# Patient Record
Sex: Female | Born: 2000
Health system: Southern US, Community
[De-identification: ages and names within clinical notes are randomized; demographics above are authoritative.]

## PROBLEM LIST (undated history)

## (undated) DIAGNOSIS — N946 Dysmenorrhea, unspecified: Secondary | ICD-10-CM

## (undated) HISTORY — PX: ANKLE SURGERY: SHX546

## (undated) HISTORY — DX: Dysmenorrhea, unspecified: N94.6

## (undated) HISTORY — PX: OTHER SURGICAL HISTORY: SHX169

## (undated) HISTORY — PX: NOSE SURGERY: SHX723

## (undated) HISTORY — PX: SHOULDER SURGERY: SHX246

---
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
## 2020-01-03 ENCOUNTER — Other Ambulatory Visit: Payer: Self-pay | Admitting: Obstetrics and Gynecology

## 2020-01-03 DIAGNOSIS — N946 Dysmenorrhea, unspecified: Secondary | ICD-10-CM

## 2020-01-03 DIAGNOSIS — Z30011 Encounter for initial prescription of contraceptive pills: Secondary | ICD-10-CM

## 2020-02-08 ENCOUNTER — Other Ambulatory Visit: Payer: Self-pay | Admitting: Obstetrics and Gynecology

## 2020-02-08 DIAGNOSIS — Z30011 Encounter for initial prescription of contraceptive pills: Secondary | ICD-10-CM

## 2020-02-08 DIAGNOSIS — N946 Dysmenorrhea, unspecified: Secondary | ICD-10-CM

## 2020-02-16 ENCOUNTER — Other Ambulatory Visit: Payer: Self-pay | Admitting: Obstetrics and Gynecology

## 2020-02-16 DIAGNOSIS — N946 Dysmenorrhea, unspecified: Secondary | ICD-10-CM

## 2020-02-16 DIAGNOSIS — Z30011 Encounter for initial prescription of contraceptive pills: Secondary | ICD-10-CM

## 2020-02-29 NOTE — Progress Notes (Signed)
PCP:  Earleen Newport, NP   Chief Complaint  Patient presents with  . Annual Exam     HPI:      Tara Perez is a 19 y.o. G0P0000 whose LMP was Patient's last menstrual period was 02/09/2020., presents today for her [facility] f/u.  Her menses are regular every 28-30 days, lasting 3 days.  Dysmenorrhea mild, much improved with OCPs. She does not have intermenstrual bleeding.  Sex activity: never.  Last Pap: N/A due to age Hx of STDs: none  There is no FH of breast cancer. There is no FH of ovarian cancer. The patient does do self-breast exams.  Tobacco use: The patient denies current or previous tobacco use. Alcohol use: none No drug use.  Exercise: moderately active  She does get adequate calcium but not Vitamin D in her diet.    Upstream - 03/01/20 0806      Pregnancy Intention Screening   Does the patient want to become pregnant in the next year? No    Does the patient's partner want to become pregnant in the next year? No    Would the patient like to discuss contraceptive options today? No      Contraception Wrap Up   Current Method Oral Contraceptive    Contraception Counseling Provided Yes          The pregnancy intention screening data noted above was reviewed. Potential methods of contraception were discussed. The patient elected to proceed with Oral Contraceptive.    Past Medical History:  Diagnosis Date  . Dysmenorrhea in adolescent     Past Surgical History:  Procedure Laterality Date  . ANKLE SURGERY    . NOSE SURGERY    . OTHER SURGICAL HISTORY     tubes in both ears, about 1 or 19 yrs old  . OTHER SURGICAL HISTORY  2014   miniscus   . SHOULDER SURGERY      No family history on file.  Social History   Socioeconomic History  . Marital status: Single    Spouse name: Not on file  . Number of children: Not on file  . Years of education: Not on file  . Highest education level: Not on file  Occupational History  . Not on file  Tobacco  Use  . Smoking status: Never Smoker  . Smokeless tobacco: Never Used  Vaping Use  . Vaping Use: Never used  Substance and Sexual Activity  . Alcohol use: Never  . Drug use: Never  . Sexual activity: Never    Birth control/protection: None  Other Topics Concern  . Not on file  Social History Narrative  . Not on file   Social Determinants of Health   Financial Resource Strain:   . Difficulty of Paying Living Expenses:   Food Insecurity:   . Worried About Programme researcher, broadcasting/film/video in the Last Year:   . Barista in the Last Year:   Transportation Needs:   . Freight forwarder (Medical):   Marland Kitchen Lack of Transportation (Non-Medical):   Physical Activity:   . Days of Exercise per Week:   . Minutes of Exercise per Session:   Stress:   . Feeling of Stress :   Social Connections:   . Frequency of Communication with Friends and Family:   . Frequency of Social Gatherings with Friends and Family:   . Attends Religious Services:   . Active Member of Clubs or Organizations:   . Attends Club  or Organization Meetings:   Marland Kitchen Marital Status:   Intimate Partner Violence:   . Fear of Current or Ex-Partner:   . Emotionally Abused:   Marland Kitchen Physically Abused:   . Sexually Abused:      Current Outpatient Medications:  .  Norethindrone Acetate-Ethinyl Estrad-FE (MICROGESTIN 24 FE) 1-20 MG-MCG(24) tablet, Take 1 tablet by mouth daily., Disp: 84 tablet, Rfl: 3   ROS:  Review of Systems  Constitutional: Negative for fatigue, fever and unexpected weight change.  Respiratory: Negative for cough, shortness of breath and wheezing.   Cardiovascular: Negative for chest pain, palpitations and leg swelling.  Gastrointestinal: Negative for blood in stool, constipation, diarrhea, nausea and vomiting.  Endocrine: Negative for cold intolerance, heat intolerance and polyuria.  Genitourinary: Negative for dyspareunia, dysuria, flank pain, frequency, genital sores, hematuria, menstrual problem, pelvic pain,  urgency, vaginal bleeding, vaginal discharge and vaginal pain.  Musculoskeletal: Negative for back pain, joint swelling and myalgias.  Skin: Negative for rash.  Neurological: Negative for dizziness, syncope, light-headedness, numbness and headaches.  Hematological: Negative for adenopathy.  Psychiatric/Behavioral: Negative for agitation, confusion, sleep disturbance and suicidal ideas. The patient is not nervous/anxious.     Objective: BP 122/74   Ht 5\' 7"  (1.702 m)   Wt 170 lb (77.1 kg)   LMP 02/09/2020   BMI 26.63 kg/m    Physical Exam Constitutional:      Appearance: She is well-developed.  Pulmonary:     Effort: Pulmonary effort is normal.  Musculoskeletal:        General: Normal range of motion.     Cervical back: Normal range of motion.  Neurological:     Mental Status: She is alert and oriented to person, place, and time.  Skin:    General: Skin is warm.  Psychiatric:        Behavior: Behavior normal.        Thought Content: Thought content normal.     Assessment/Plan: Encounter for surveillance of contraceptive pills - Plan: Norethindrone Acetate-Ethinyl Estrad-FE (MICROGESTIN 24 FE) 1-20 MG-MCG(24) tablet; OCP RF. Doing well.   Dysmenorrhea in adolescent - Plan: Norethindrone Acetate-Ethinyl Estrad-FE (MICROGESTIN 24 FE) 1-20 MG-MCG(24) tablet; Improved with OCPs  Meds ordered this encounter  Medications  . Norethindrone Acetate-Ethinyl Estrad-FE (MICROGESTIN 24 FE) 1-20 MG-MCG(24) tablet    Sig: Take 1 tablet by mouth daily.    Dispense:  84 tablet    Refill:  3    Order Specific Question:   Supervising Provider    Answer:   02/11/2020 Nadara Mustard             GYN counsel adequate intake of calcium and vitamin D, diet and exercise, Gardasil discussed.      F/U  Return in about 1 year (around 03/01/2021).  Madix Blowe B. Hans Rusher, PA-C 03/01/2020 10:38 AM

## 2020-03-01 ENCOUNTER — Encounter: Payer: Self-pay | Admitting: Obstetrics and Gynecology

## 2020-03-01 ENCOUNTER — Ambulatory Visit (INDEPENDENT_AMBULATORY_CARE_PROVIDER_SITE_OTHER): Payer: 59 | Admitting: Obstetrics and Gynecology

## 2020-03-01 ENCOUNTER — Other Ambulatory Visit: Payer: Self-pay

## 2020-03-01 VITALS — BP 122/74 | Ht 67.0 in | Wt 170.0 lb

## 2020-03-01 DIAGNOSIS — N946 Dysmenorrhea, unspecified: Secondary | ICD-10-CM

## 2020-03-01 DIAGNOSIS — Z3041 Encounter for surveillance of contraceptive pills: Secondary | ICD-10-CM

## 2020-03-01 MED ORDER — MICROGESTIN 24 FE 1-20 MG-MCG PO TABS: 1.0000 | ORAL_TABLET | Freq: Every day | ORAL | 3 refills | Status: DC

## 2020-03-01 NOTE — Patient Instructions (Signed)
I value your feedback and entrusting us with your care. If you get a [facility] patient survey, I would appreciate you taking the time to let us know about your experience today. Thank you!  As of July 03, 2019, your lab results will be released to your MyChart immediately, before I even have a chance to see them. Please give me time to review them and contact you if there are any abnormalities. Thank you for your patience.  

## 2020-05-12 IMAGING — CR DG LUMBAR SPINE COMPLETE 4+V
5 series · 5 of 5 positions shown · non-contrast
Comparison: None.

CLINICAL DATA: Bilateral lower leg cramping.

EXAM:
LUMBAR SPINE - COMPLETE 4+ VIEW

[t l-spine a.p.]
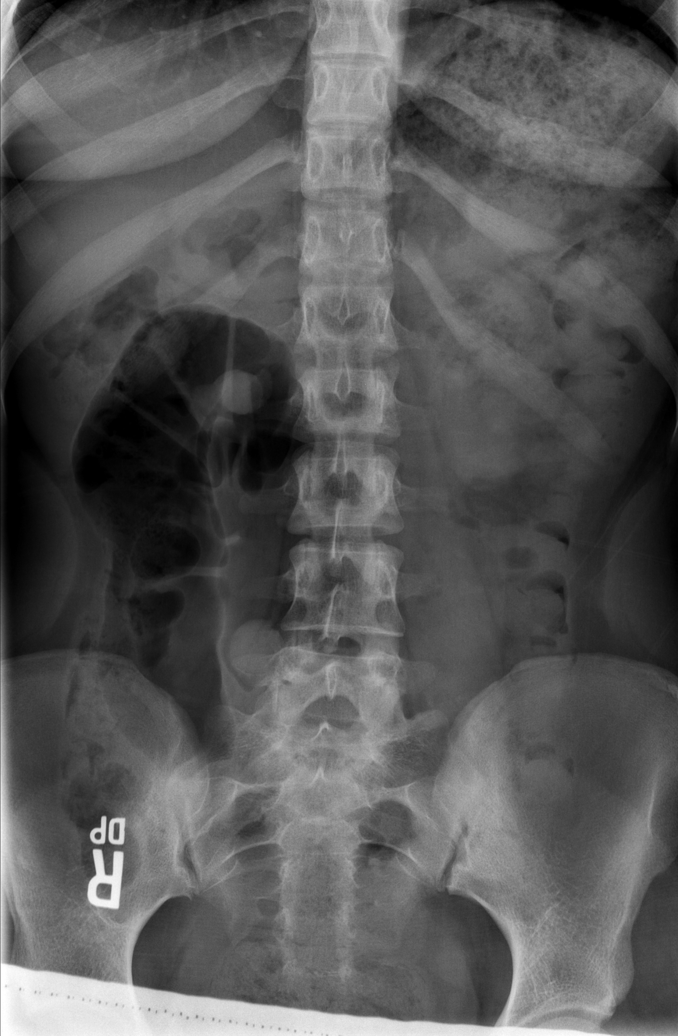

[t l-spine oblique exposure (1 of 2)]
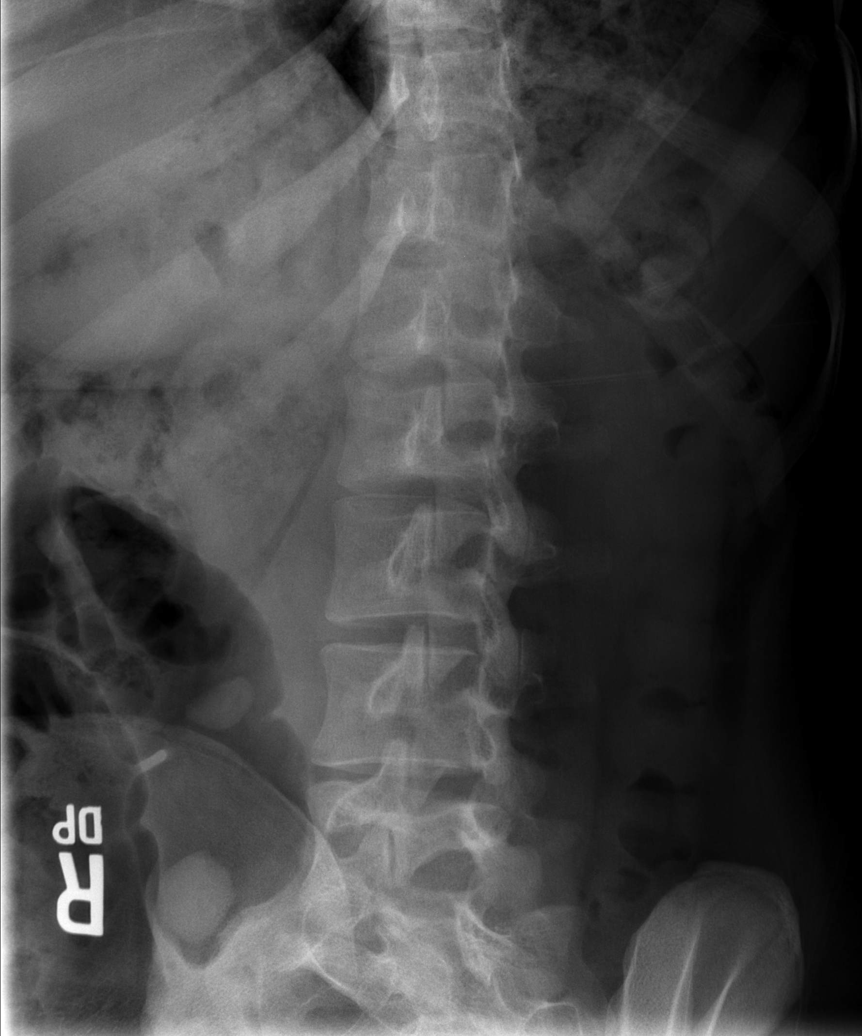

[t l-spine oblique exposure (2 of 2)]
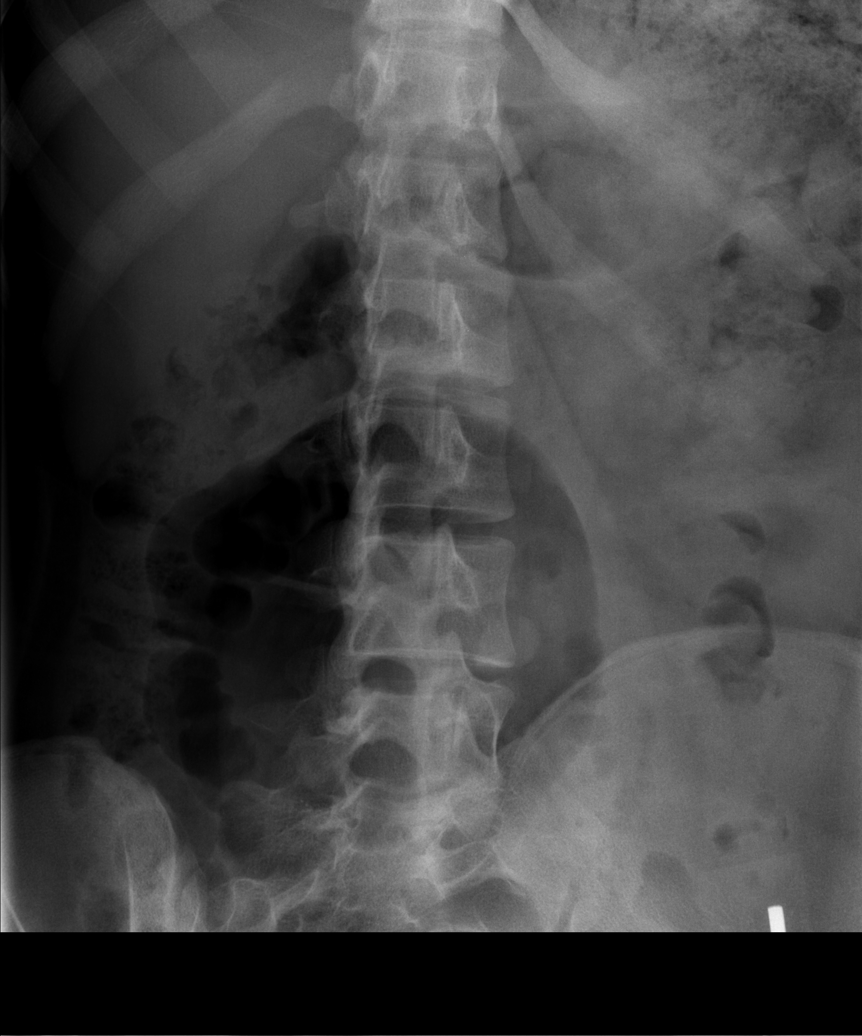

[t l-spine lat]
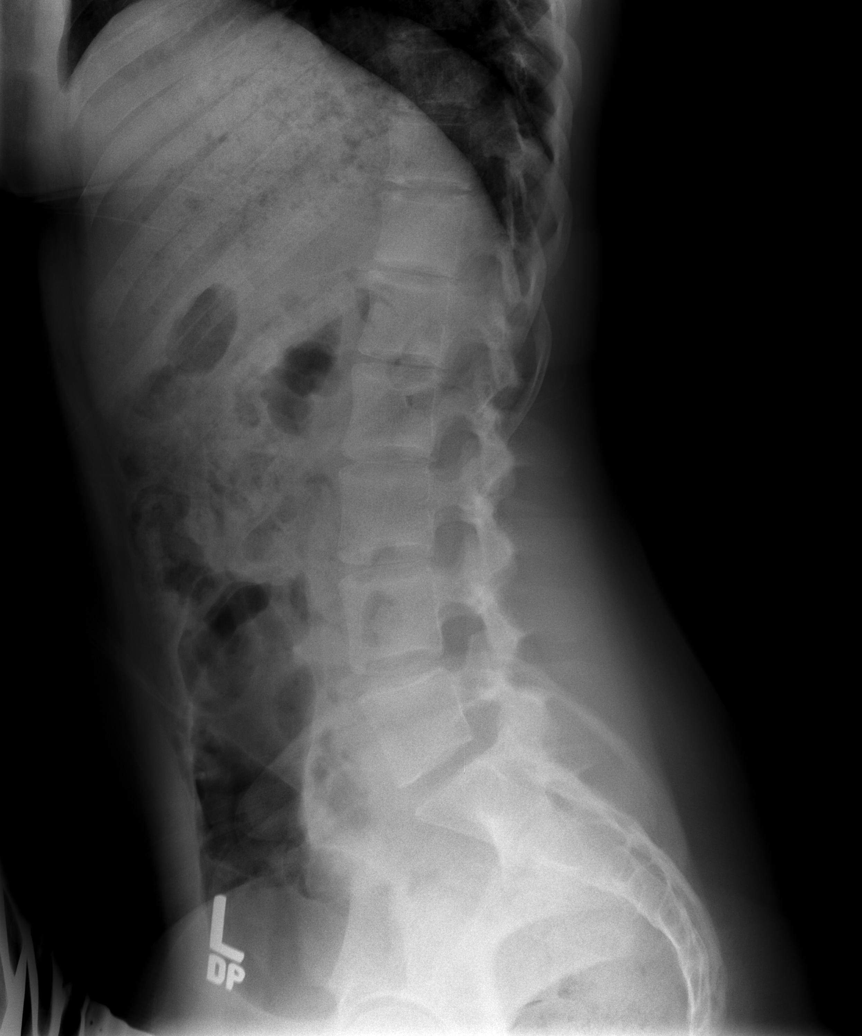

[t l-spine l5-s1 spot]
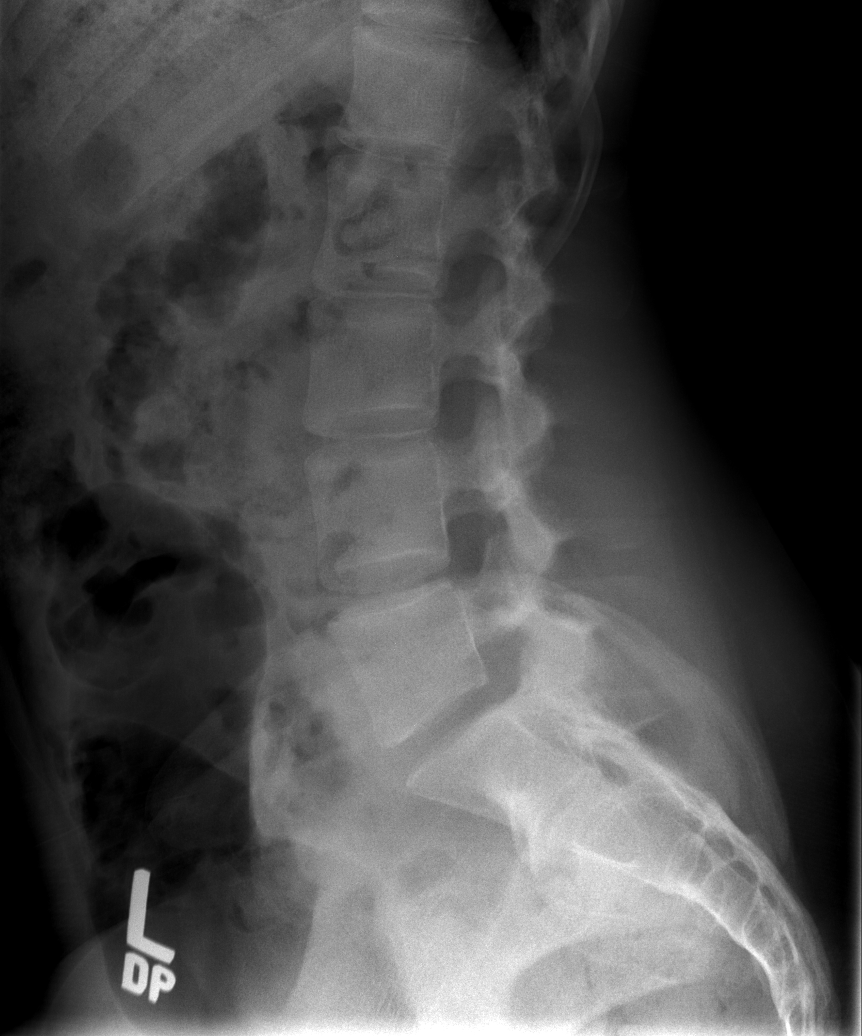

[5 of 5 positions shown; findings below may reference images not displayed]

FINDINGS: There is no evidence of lumbar spine fracture. Alignment is normal.
Intervertebral disc spaces are maintained.
IMPRESSION: Negative.

## 2021-03-12 ENCOUNTER — Other Ambulatory Visit: Payer: Self-pay | Admitting: Obstetrics and Gynecology

## 2021-03-12 DIAGNOSIS — Z3041 Encounter for surveillance of contraceptive pills: Secondary | ICD-10-CM

## 2021-03-12 DIAGNOSIS — N946 Dysmenorrhea, unspecified: Secondary | ICD-10-CM

## 2021-04-01 ENCOUNTER — Ambulatory Visit: Payer: 59 | Admitting: Obstetrics and Gynecology

## 2021-06-06 ENCOUNTER — Other Ambulatory Visit: Payer: Self-pay | Admitting: Obstetrics and Gynecology

## 2021-06-06 DIAGNOSIS — N946 Dysmenorrhea, unspecified: Secondary | ICD-10-CM

## 2021-06-06 DIAGNOSIS — Z3041 Encounter for surveillance of contraceptive pills: Secondary | ICD-10-CM

## 2021-09-16 ENCOUNTER — Other Ambulatory Visit: Payer: Self-pay | Admitting: Neurology

## 2021-09-16 DIAGNOSIS — G44309 Post-traumatic headache, unspecified, not intractable: Secondary | ICD-10-CM

## 2021-09-22 ENCOUNTER — Other Ambulatory Visit: Payer: Self-pay | Admitting: Obstetrics and Gynecology

## 2021-09-22 ENCOUNTER — Ambulatory Visit
Admission: RE | Admit: 2021-09-22 | Discharge: 2021-09-22 | Disposition: A | Discharge status: Home / Self Care | Payer: 59 | Source: Ambulatory Visit | Attending: Neurology | Admitting: Neurology

## 2021-09-22 DIAGNOSIS — Z3041 Encounter for surveillance of contraceptive pills: Secondary | ICD-10-CM

## 2021-09-22 DIAGNOSIS — G44309 Post-traumatic headache, unspecified, not intractable: Secondary | ICD-10-CM

## 2021-09-22 DIAGNOSIS — N946 Dysmenorrhea, unspecified: Secondary | ICD-10-CM

## 2021-09-22 MED ORDER — GADOBENATE DIMEGLUMINE 529 MG/ML IV SOLN
15.0000 mL | Freq: Once | INTRAVENOUS | Status: AC | PRN
  Administered 2021-09-22: 15 mL via INTRAVENOUS

## 2021-09-24 ENCOUNTER — Other Ambulatory Visit: Payer: Self-pay | Admitting: Obstetrics and Gynecology

## 2021-09-24 DIAGNOSIS — N946 Dysmenorrhea, unspecified: Secondary | ICD-10-CM

## 2021-09-24 DIAGNOSIS — Z3041 Encounter for surveillance of contraceptive pills: Secondary | ICD-10-CM

## 2021-09-26 ENCOUNTER — Telehealth: Payer: Self-pay

## 2021-09-26 ENCOUNTER — Other Ambulatory Visit: Payer: Self-pay | Admitting: Obstetrics and Gynecology

## 2021-09-26 DIAGNOSIS — N946 Dysmenorrhea, unspecified: Secondary | ICD-10-CM

## 2021-09-26 DIAGNOSIS — Z3041 Encounter for surveillance of contraceptive pills: Secondary | ICD-10-CM

## 2021-09-26 NOTE — Telephone Encounter (Signed)
Pt calling to see why refill of bc was denied.  [phone] ?

## 2021-09-27 MED ORDER — HAILEY 24 FE 1-20 MG-MCG(24) PO TABS: 1.0000 | ORAL_TABLET | Freq: Every day | ORAL | 0 refills | Status: DC

## 2021-09-27 NOTE — Telephone Encounter (Signed)
Called pt to verify pharm which is [facility]; aware refill eRx'd. ?

## 2021-09-27 NOTE — Telephone Encounter (Signed)
Patient is scheduled for 11/03/21 with ABC

## 2021-09-27 NOTE — Telephone Encounter (Signed)
Called and left voicemail for patient to call back to be scheduled. 

## 2021-11-03 ENCOUNTER — Ambulatory Visit: Payer: 59 | Admitting: Obstetrics and Gynecology

## 2021-12-12 NOTE — Progress Notes (Unsigned)
PCP:  Earleen Newport, NP   No chief complaint on file.    HPI:      [name] is a 21 y.o. G0P0000 whose LMP was No LMP recorded., presents today for her BC f/u.  Her menses are regular every 28-30 days, lasting 3 days.  Dysmenorrhea mild, much improved with OCPs. She does not have intermenstrual bleeding.  Sex activity: never.  Last Pap: N/A due to age Hx of STDs: none  There is no FH of breast cancer. There is no FH of ovarian cancer. The patient does do self-breast exams.  Tobacco use: The patient denies current or previous tobacco use. Alcohol use: none No drug use.  Exercise: moderately active  She does get adequate calcium but not Vitamin D in her diet.     The pregnancy intention screening data noted above was reviewed. Potential methods of contraception were discussed. The patient elected to proceed with Oral Contraceptive.    Past Medical History:  Diagnosis Date   Dysmenorrhea in adolescent     Past Surgical History:  Procedure Laterality Date   ANKLE SURGERY     NOSE SURGERY     OTHER SURGICAL HISTORY     tubes in both ears, about 1 or 21 yrs old   OTHER SURGICAL HISTORY  2014   miniscus    SHOULDER SURGERY      No family history on file.  Social History   Socioeconomic History   Marital status: Single    Spouse name: Not on file   Number of children: Not on file   Years of education: Not on file   Highest education level: Not on file  Occupational History   Not on file  Tobacco Use   Smoking status: Never   Smokeless tobacco: Never  Vaping Use   Vaping Use: Never used  Substance and Sexual Activity   Alcohol use: Never   Drug use: Never   Sexual activity: Never    Birth control/protection: None  Other Topics Concern   Not on file  Social History Narrative   Not on file   Social Determinants of Health   Financial Resource Strain: Not on file  Food Insecurity: Not on file  Transportation Needs: Not on file  Physical  Activity: Not on file  Stress: Not on file  Social Connections: Not on file  Intimate Partner Violence: Not on file     Current Outpatient Medications:    Norethindrone Acetate-Ethinyl Estrad-FE (HAILEY 24 FE) 1-20 MG-MCG(24) tablet, Take 1 tablet by mouth daily., Disp: 84 tablet, Rfl: 0   ROS:  Review of Systems  Constitutional:  Negative for fatigue, fever and unexpected weight change.  Respiratory:  Negative for cough, shortness of breath and wheezing.   Cardiovascular:  Negative for chest pain, palpitations and leg swelling.  Gastrointestinal:  Negative for blood in stool, constipation, diarrhea, nausea and vomiting.  Endocrine: Negative for cold intolerance, heat intolerance and polyuria.  Genitourinary:  Negative for dyspareunia, dysuria, flank pain, frequency, genital sores, hematuria, menstrual problem, pelvic pain, urgency, vaginal bleeding, vaginal discharge and vaginal pain.  Musculoskeletal:  Negative for back pain, joint swelling and myalgias.  Skin:  Negative for rash.  Neurological:  Negative for dizziness, syncope, light-headedness, numbness and headaches.  Hematological:  Negative for adenopathy.  Psychiatric/Behavioral:  Negative for agitation, confusion, sleep disturbance and suicidal ideas. The patient is not nervous/anxious.    Objective: There were no vitals taken for this visit.   Physical  Exam Constitutional:      Appearance: She is well-developed.  Pulmonary:     Effort: Pulmonary effort is normal.  Musculoskeletal:        General: Normal range of motion.     Cervical back: Normal range of motion.  Neurological:     Mental Status: She is alert and oriented to person, place, and time.  Skin:    General: Skin is warm.  Psychiatric:        Behavior: Behavior normal.        Thought Content: Thought content normal.    Assessment/Plan: Encounter for surveillance of contraceptive pills - Plan: Norethindrone Acetate-Ethinyl Estrad-FE (MICROGESTIN 24  FE) 1-20 MG-MCG(24) tablet; OCP RF. Doing well.   Dysmenorrhea in adolescent - Plan: Norethindrone Acetate-Ethinyl Estrad-FE (MICROGESTIN 24 FE) 1-20 MG-MCG(24) tablet; Improved with OCPs  No orders of the defined types were placed in this encounter.            GYN counsel adequate intake of calcium and vitamin D, diet and exercise, Gardasil discussed.      F/U  No follow-ups on file.  Hennie Gosa B. Terrill Wauters, PA-C 12/12/2021 1:30 PM

## 2021-12-13 ENCOUNTER — Other Ambulatory Visit (HOSPITAL_COMMUNITY)
Admission: RE | Admit: 2021-12-13 | Discharge: 2021-12-13 | Disposition: A | Discharge status: Home / Self Care | Payer: 59 | Source: Ambulatory Visit | Attending: Obstetrics and Gynecology | Admitting: Obstetrics and Gynecology

## 2021-12-13 ENCOUNTER — Encounter: Payer: Self-pay | Admitting: Obstetrics and Gynecology

## 2021-12-13 ENCOUNTER — Ambulatory Visit (INDEPENDENT_AMBULATORY_CARE_PROVIDER_SITE_OTHER): Payer: 59 | Admitting: Obstetrics and Gynecology

## 2021-12-13 VITALS — BP 120/70 | Ht 67.0 in | Wt 183.0 lb

## 2021-12-13 DIAGNOSIS — N946 Dysmenorrhea, unspecified: Secondary | ICD-10-CM

## 2021-12-13 DIAGNOSIS — Z124 Encounter for screening for malignant neoplasm of cervix: Secondary | ICD-10-CM

## 2021-12-13 DIAGNOSIS — Z01419 Encounter for gynecological examination (general) (routine) without abnormal findings: Secondary | ICD-10-CM

## 2021-12-13 DIAGNOSIS — Z3041 Encounter for surveillance of contraceptive pills: Secondary | ICD-10-CM

## 2021-12-13 DIAGNOSIS — Z113 Encounter for screening for infections with a predominantly sexual mode of transmission: Secondary | ICD-10-CM

## 2021-12-13 MED ORDER — HAILEY 24 FE 1-20 MG-MCG(24) PO TABS: 1.0000 | ORAL_TABLET | Freq: Every day | ORAL | 3 refills | Status: AC

## 2021-12-15 LAB — CYTOLOGY - PAP
Adequacy: ABSENT
Diagnosis: NEGATIVE

## 2023-03-12 ENCOUNTER — Ambulatory Visit: Payer: 59 | Admitting: Obstetrics and Gynecology
